# Patient Record
Sex: Male | Born: 1945
Health system: Southern US, Community
[De-identification: ages and names within clinical notes are randomized; demographics above are authoritative.]

---

## 2017-03-22 DIAGNOSIS — H34811 Central retinal vein occlusion, right eye, with macular edema: Secondary | ICD-10-CM | POA: Diagnosis not present

## 2017-03-22 DIAGNOSIS — H3582 Retinal ischemia: Secondary | ICD-10-CM | POA: Diagnosis not present

## 2017-05-03 DIAGNOSIS — H34811 Central retinal vein occlusion, right eye, with macular edema: Secondary | ICD-10-CM | POA: Diagnosis not present

## 2017-05-09 DIAGNOSIS — R69 Illness, unspecified: Secondary | ICD-10-CM | POA: Diagnosis not present

## 2017-05-31 DIAGNOSIS — H34811 Central retinal vein occlusion, right eye, with macular edema: Secondary | ICD-10-CM | POA: Diagnosis not present

## 2017-06-13 DIAGNOSIS — R7301 Impaired fasting glucose: Secondary | ICD-10-CM | POA: Diagnosis not present

## 2017-06-13 DIAGNOSIS — R21 Rash and other nonspecific skin eruption: Secondary | ICD-10-CM | POA: Diagnosis not present

## 2017-06-13 DIAGNOSIS — Z86010 Personal history of colonic polyps: Secondary | ICD-10-CM | POA: Diagnosis not present

## 2017-06-13 DIAGNOSIS — I10 Essential (primary) hypertension: Secondary | ICD-10-CM | POA: Diagnosis not present

## 2017-06-13 DIAGNOSIS — E782 Mixed hyperlipidemia: Secondary | ICD-10-CM | POA: Diagnosis not present

## 2017-06-13 DIAGNOSIS — E039 Hypothyroidism, unspecified: Secondary | ICD-10-CM | POA: Diagnosis not present

## 2017-06-13 DIAGNOSIS — Z87898 Personal history of other specified conditions: Secondary | ICD-10-CM | POA: Diagnosis not present

## 2017-06-13 DIAGNOSIS — Z0000 Encounter for general adult medical examination without abnormal findings: Secondary | ICD-10-CM | POA: Diagnosis not present

## 2017-06-13 DIAGNOSIS — Z1389 Encounter for screening for other disorder: Secondary | ICD-10-CM | POA: Diagnosis not present

## 2017-06-13 DIAGNOSIS — N400 Enlarged prostate without lower urinary tract symptoms: Secondary | ICD-10-CM | POA: Diagnosis not present

## 2017-07-05 DIAGNOSIS — H34811 Central retinal vein occlusion, right eye, with macular edema: Secondary | ICD-10-CM | POA: Diagnosis not present

## 2017-08-09 DIAGNOSIS — H34811 Central retinal vein occlusion, right eye, with macular edema: Secondary | ICD-10-CM | POA: Diagnosis not present

## 2017-09-20 DIAGNOSIS — H34811 Central retinal vein occlusion, right eye, with macular edema: Secondary | ICD-10-CM | POA: Diagnosis not present

## 2017-10-26 DIAGNOSIS — H34811 Central retinal vein occlusion, right eye, with macular edema: Secondary | ICD-10-CM | POA: Diagnosis not present

## 2017-11-15 DIAGNOSIS — Z23 Encounter for immunization: Secondary | ICD-10-CM | POA: Diagnosis not present

## 2017-11-27 DIAGNOSIS — H34811 Central retinal vein occlusion, right eye, with macular edema: Secondary | ICD-10-CM | POA: Diagnosis not present

## 2017-12-04 DIAGNOSIS — R69 Illness, unspecified: Secondary | ICD-10-CM | POA: Diagnosis not present

## 2018-01-01 DIAGNOSIS — H34811 Central retinal vein occlusion, right eye, with macular edema: Secondary | ICD-10-CM | POA: Diagnosis not present

## 2018-02-06 DIAGNOSIS — H34811 Central retinal vein occlusion, right eye, with macular edema: Secondary | ICD-10-CM | POA: Diagnosis not present

## 2018-03-12 DIAGNOSIS — H34811 Central retinal vein occlusion, right eye, with macular edema: Secondary | ICD-10-CM | POA: Diagnosis not present

## 2018-04-16 DIAGNOSIS — H34811 Central retinal vein occlusion, right eye, with macular edema: Secondary | ICD-10-CM | POA: Diagnosis not present

## 2018-05-21 DIAGNOSIS — R69 Illness, unspecified: Secondary | ICD-10-CM | POA: Diagnosis not present

## 2018-05-22 DIAGNOSIS — H34811 Central retinal vein occlusion, right eye, with macular edema: Secondary | ICD-10-CM | POA: Diagnosis not present

## 2018-06-24 DIAGNOSIS — H34811 Central retinal vein occlusion, right eye, with macular edema: Secondary | ICD-10-CM | POA: Diagnosis not present

## 2018-07-23 DIAGNOSIS — H5213 Myopia, bilateral: Secondary | ICD-10-CM | POA: Diagnosis not present

## 2018-07-25 DIAGNOSIS — I10 Essential (primary) hypertension: Secondary | ICD-10-CM | POA: Diagnosis not present

## 2018-07-25 DIAGNOSIS — Z125 Encounter for screening for malignant neoplasm of prostate: Secondary | ICD-10-CM | POA: Diagnosis not present

## 2018-07-25 DIAGNOSIS — R7301 Impaired fasting glucose: Secondary | ICD-10-CM | POA: Diagnosis not present

## 2018-08-01 DIAGNOSIS — Z86010 Personal history of colonic polyps: Secondary | ICD-10-CM | POA: Diagnosis not present

## 2018-08-01 DIAGNOSIS — I10 Essential (primary) hypertension: Secondary | ICD-10-CM | POA: Diagnosis not present

## 2018-08-01 DIAGNOSIS — Z0000 Encounter for general adult medical examination without abnormal findings: Secondary | ICD-10-CM | POA: Diagnosis not present

## 2018-08-01 DIAGNOSIS — N400 Enlarged prostate without lower urinary tract symptoms: Secondary | ICD-10-CM | POA: Diagnosis not present

## 2018-08-01 DIAGNOSIS — H348112 Central retinal vein occlusion, right eye, stable: Secondary | ICD-10-CM | POA: Diagnosis not present

## 2018-08-01 DIAGNOSIS — E782 Mixed hyperlipidemia: Secondary | ICD-10-CM | POA: Diagnosis not present

## 2018-08-01 DIAGNOSIS — Z87891 Personal history of nicotine dependence: Secondary | ICD-10-CM | POA: Diagnosis not present

## 2018-08-01 DIAGNOSIS — E039 Hypothyroidism, unspecified: Secondary | ICD-10-CM | POA: Diagnosis not present

## 2018-08-01 DIAGNOSIS — R7301 Impaired fasting glucose: Secondary | ICD-10-CM | POA: Diagnosis not present

## 2018-08-06 ENCOUNTER — Other Ambulatory Visit: Payer: Self-pay | Admitting: Family Medicine

## 2018-08-06 DIAGNOSIS — Z87891 Personal history of nicotine dependence: Secondary | ICD-10-CM

## 2018-08-07 DIAGNOSIS — L237 Allergic contact dermatitis due to plants, except food: Secondary | ICD-10-CM | POA: Diagnosis not present

## 2018-08-08 ENCOUNTER — Ambulatory Visit
Admission: RE | Admit: 2018-08-08 | Discharge: 2018-08-08 | Disposition: A | Discharge status: Home / Self Care | Payer: Medicare HMO | Source: Ambulatory Visit | Attending: Family Medicine | Admitting: Family Medicine

## 2018-08-08 ENCOUNTER — Other Ambulatory Visit: Payer: Self-pay

## 2018-08-08 DIAGNOSIS — Z87891 Personal history of nicotine dependence: Secondary | ICD-10-CM | POA: Diagnosis not present

## 2018-08-08 DIAGNOSIS — Z136 Encounter for screening for cardiovascular disorders: Secondary | ICD-10-CM | POA: Diagnosis not present

## 2018-09-12 DIAGNOSIS — H348112 Central retinal vein occlusion, right eye, stable: Secondary | ICD-10-CM | POA: Diagnosis not present

## 2018-09-30 DIAGNOSIS — Z20828 Contact with and (suspected) exposure to other viral communicable diseases: Secondary | ICD-10-CM | POA: Diagnosis not present

## 2018-10-01 DIAGNOSIS — Z20828 Contact with and (suspected) exposure to other viral communicable diseases: Secondary | ICD-10-CM | POA: Diagnosis not present

## 2018-10-11 DIAGNOSIS — M722 Plantar fascial fibromatosis: Secondary | ICD-10-CM | POA: Diagnosis not present

## 2018-11-02 DIAGNOSIS — E039 Hypothyroidism, unspecified: Secondary | ICD-10-CM | POA: Diagnosis not present

## 2018-11-02 DIAGNOSIS — E782 Mixed hyperlipidemia: Secondary | ICD-10-CM | POA: Diagnosis not present

## 2018-11-02 DIAGNOSIS — N400 Enlarged prostate without lower urinary tract symptoms: Secondary | ICD-10-CM | POA: Diagnosis not present

## 2018-11-02 DIAGNOSIS — I10 Essential (primary) hypertension: Secondary | ICD-10-CM | POA: Diagnosis not present

## 2018-11-05 ENCOUNTER — Ambulatory Visit: Payer: Medicare HMO | Admitting: Podiatry

## 2018-11-05 ENCOUNTER — Other Ambulatory Visit: Payer: Self-pay | Admitting: Podiatry

## 2018-11-05 ENCOUNTER — Ambulatory Visit (INDEPENDENT_AMBULATORY_CARE_PROVIDER_SITE_OTHER): Payer: Medicare HMO

## 2018-11-05 ENCOUNTER — Other Ambulatory Visit: Payer: Self-pay

## 2018-11-05 DIAGNOSIS — M722 Plantar fascial fibromatosis: Secondary | ICD-10-CM

## 2018-11-05 DIAGNOSIS — M7732 Calcaneal spur, left foot: Secondary | ICD-10-CM | POA: Diagnosis not present

## 2018-11-05 DIAGNOSIS — M79672 Pain in left foot: Secondary | ICD-10-CM

## 2018-11-05 MED ORDER — MELOXICAM 15 MG PO TABS: 15.0000 mg | ORAL_TABLET | Freq: Every day | ORAL | 0 refills | Status: DC

## 2018-11-05 NOTE — Patient Instructions (Signed)

## 2018-11-05 NOTE — Progress Notes (Signed)
Subjective:   Patient ID: Michael Barajas, male   DOB: 73 y.o.   MRN: [id number]   HPI 73 year old male presents the office today for concerns of pain to the bottom of his left heel is been on the last 2 months.  He did purchase an over-the-counter pad which is been somewhat helpful he saw his primary care physician was told that she has not needed to be addressed.  He denies any recent injury.  Pain is localized to the heel.  No radiating pain.  No injury.  No numbness or tingling no weakness or falls.  Review of Systems  All other systems reviewed and are negative.  No past medical history on file.    Current Outpatient Medications:  .  augmented betamethasone dipropionate (DIPROLENE-AF) 0.05 % cream, APPLY CREAM TOPICALLY TO AFFECTED AREA TWICE DAILY FOR 30 DAYS, Disp: , Rfl:  .  levothyroxine (SYNTHROID) 50 MCG tablet, TAKE 1 TABLET BY MOUTH IN THE MORNING ON AN EMPTY STOMACH, Disp: , Rfl:  .  losartan (COZAAR) 50 MG tablet, TAKE 1 TABLET BY MOUTH ONCE DAILY FOR 90 DAYS, Disp: , Rfl:  .  predniSONE (DELTASONE) 20 MG tablet, TAKE 3 TABLETS BY MOUTH ONCE DAILY FOR 2 DAYS THEN 2 TABS ONCE DAILY FOR 2 DAYS THEN 1 TAB ONCE DAILY FOR 2 DAYS THEN 1 2 (ONE HALF) TAB ONC, Disp: , Rfl:  .  simvastatin (ZOCOR) 20 MG tablet, Take 20 mg by mouth daily., Disp: , Rfl:  .  tamsulosin (FLOMAX) 0.4 MG CAPS capsule, TAKE 1 CAPSULE BY MOUTH ONCE DAILY FOR 90 DAYS, Disp: , Rfl:  .  meloxicam (MOBIC) 15 MG tablet, Take 1 tablet (15 mg total) by mouth daily., Disp: 30 tablet, Rfl: 0  No Known Allergies       Objective:  Physical Exam  General: AAO x3, NAD  Dermatological: Skin is warm, dry and supple bilateral. Nails x 10 are well manicured; remaining integument appears unremarkable at this time. There are no open sores, no preulcerative lesions, no rash or signs of infection present.  Vascular: Dorsalis Pedis artery and Posterior Tibial artery pedal pulses are 2/4 bilateral with immedate capillary  fill time. Pedal hair growth present. No varicosities and no lower extremity edema present bilateral. There is no pain with calf compression, swelling, warmth, erythema.   Neruologic: Grossly intact via light touch bilateral. Vibratory intact via tuning fork bilateral. Protective threshold with Semmes Wienstein monofilament intact to all pedal sites bilateral. Negative tinel sign.   Musculoskeletal: Tenderness to palpation along the plantar medial tubercle of the calcaneus at the insertion of plantar fascia on the left foot. There is no pain along the course of the plantar fascia within the arch of the foot. Plantar fascia appears to be intact. There is no pain with lateral compression of the calcaneus or pain with vibratory sensation. There is no pain along the course or insertion of the achilles tendon. No other areas of tenderness to bilateral lower extremities. Muscular strength 5/5 in all groups tested bilateral.  Decreased medial arch upon weightbearing.  Gait: Unassisted, Nonantalgic.       Assessment:   Left foot heel pain, plantar fasciitis, bone spur     Plan:  -Treatment options discussed including all alternatives, risks, and complications -Etiology of symptoms were discussed -X-rays were obtained and reviewed with the patient.  Impaired calcaneal spur present.  No evidence of acute fracture. -Steroid injection performed. See procedure note below -Prescribed mobic. Discussed side effects of  the medication and directed to stop if any are to occur and call the office.  -Stretching, icing daily. -Discussed the modifications and orthotics -Powersteps  Procedure: Injection Tendon/Ligament Discussed alternatives, risks, complications and verbal consent was obtained.  Location: Left plantar fascia at the glabrous junction; medial approach. Skin Prep: Alcohol  Injectate: 0.5cc 0.5% marcaine plain, 0.5 cc 2% lidocaine plain and, 1 cc kenalog 10. Disposition: Patient tolerated  procedure well. Injection site dressed with a band-aid.  Post-injection care was discussed and return precautions discussed.   Return in about 3 weeks (around 11/26/2018).  Trula Slade DPM

## 2018-11-07 DIAGNOSIS — H34811 Central retinal vein occlusion, right eye, with macular edema: Secondary | ICD-10-CM | POA: Diagnosis not present

## 2018-11-28 DIAGNOSIS — R69 Illness, unspecified: Secondary | ICD-10-CM | POA: Diagnosis not present

## 2018-12-03 ENCOUNTER — Other Ambulatory Visit: Payer: Self-pay

## 2018-12-03 ENCOUNTER — Ambulatory Visit (INDEPENDENT_AMBULATORY_CARE_PROVIDER_SITE_OTHER): Payer: Medicare HMO | Admitting: Podiatry

## 2018-12-03 DIAGNOSIS — M722 Plantar fascial fibromatosis: Secondary | ICD-10-CM

## 2018-12-03 MED ORDER — MELOXICAM 15 MG PO TABS: 15.0000 mg | ORAL_TABLET | Freq: Every day | ORAL | 0 refills | Status: AC

## 2018-12-03 NOTE — Progress Notes (Signed)
Subjective: 73 year old male presents the office today for follow-up evaluation of plantar fasciitis foot is left foot.  He states he is currently not experiencing any significant discomfort.  He is very minimal pain at times.  He is still doing stretching daily as well as using the meloxicam. He is asking if he can stop the mobic. He is asking for new powersteps as he changed his shoe size which has been helpful. Denies any systemic complaints such as fevers, chills, nausea, vomiting. No acute changes since last appointment, and no other complaints at this time.   Objective: AAO x3, NAD DP/PT pulses palpable bilaterally, CRT less than 3 seconds There is minimal tenderness to palpation along the plantar medial tubercle of the calcaneus at the insertion of plantar fascia on the right foot. There is no pain along the course of the plantar fascia within the arch of the foot. Plantar fascia appears to be intact. There is no pain with lateral compression of the calcaneus or pain with vibratory sensation. There is no pain along the course or insertion of the achilles tendon. No other areas of tenderness to bilateral lower extremities. No pain with calf compression, swelling, warmth, erythema  Assessment: Resolving right foot plantar fasciitis  Plan: -All treatment options discussed with the patient including all alternatives, risks, complications.  Hold off on another steroid injection today.  Continue with meloxicam but only as needed.  Continue stretching, icing daily.  Discussed modifications continue orthotics and new power steps were dispensed today as requested. -Patient encouraged to call the office with any questions, concerns, change in symptoms.   RTC prn or if symptoms don't completely resolve in the next month or so.   Trula Slade DPM

## 2018-12-13 DIAGNOSIS — Z23 Encounter for immunization: Secondary | ICD-10-CM | POA: Diagnosis not present

## 2018-12-24 DIAGNOSIS — R69 Illness, unspecified: Secondary | ICD-10-CM | POA: Diagnosis not present

## 2018-12-26 DIAGNOSIS — H34811 Central retinal vein occlusion, right eye, with macular edema: Secondary | ICD-10-CM | POA: Diagnosis not present

## 2019-02-13 DIAGNOSIS — H34811 Central retinal vein occlusion, right eye, with macular edema: Secondary | ICD-10-CM | POA: Diagnosis not present

## 2019-03-25 DIAGNOSIS — R69 Illness, unspecified: Secondary | ICD-10-CM | POA: Diagnosis not present

## 2019-04-17 DIAGNOSIS — H34811 Central retinal vein occlusion, right eye, with macular edema: Secondary | ICD-10-CM | POA: Diagnosis not present

## 2019-05-16 DIAGNOSIS — H25812 Combined forms of age-related cataract, left eye: Secondary | ICD-10-CM | POA: Diagnosis not present

## 2019-05-16 DIAGNOSIS — H34811 Central retinal vein occlusion, right eye, with macular edema: Secondary | ICD-10-CM | POA: Diagnosis not present

## 2019-06-20 DIAGNOSIS — H34811 Central retinal vein occlusion, right eye, with macular edema: Secondary | ICD-10-CM | POA: Diagnosis not present

## 2019-07-30 DIAGNOSIS — R509 Fever, unspecified: Secondary | ICD-10-CM | POA: Diagnosis not present

## 2019-07-30 DIAGNOSIS — Z1152 Encounter for screening for COVID-19: Secondary | ICD-10-CM | POA: Diagnosis not present

## 2019-08-01 DIAGNOSIS — R6883 Chills (without fever): Secondary | ICD-10-CM | POA: Diagnosis not present

## 2019-08-01 DIAGNOSIS — R509 Fever, unspecified: Secondary | ICD-10-CM | POA: Diagnosis not present

## 2019-08-01 DIAGNOSIS — R82998 Other abnormal findings in urine: Secondary | ICD-10-CM | POA: Diagnosis not present

## 2019-08-08 DIAGNOSIS — H34811 Central retinal vein occlusion, right eye, with macular edema: Secondary | ICD-10-CM | POA: Diagnosis not present

## 2019-08-14 DIAGNOSIS — E039 Hypothyroidism, unspecified: Secondary | ICD-10-CM | POA: Diagnosis not present

## 2019-08-14 DIAGNOSIS — I10 Essential (primary) hypertension: Secondary | ICD-10-CM | POA: Diagnosis not present

## 2019-08-14 DIAGNOSIS — E782 Mixed hyperlipidemia: Secondary | ICD-10-CM | POA: Diagnosis not present

## 2019-08-14 DIAGNOSIS — N400 Enlarged prostate without lower urinary tract symptoms: Secondary | ICD-10-CM | POA: Diagnosis not present

## 2019-08-28 ENCOUNTER — Other Ambulatory Visit (HOSPITAL_COMMUNITY): Payer: Self-pay | Admitting: Family Medicine

## 2019-08-28 DIAGNOSIS — E039 Hypothyroidism, unspecified: Secondary | ICD-10-CM | POA: Diagnosis not present

## 2019-08-28 DIAGNOSIS — Z0000 Encounter for general adult medical examination without abnormal findings: Secondary | ICD-10-CM | POA: Diagnosis not present

## 2019-08-28 DIAGNOSIS — Z125 Encounter for screening for malignant neoplasm of prostate: Secondary | ICD-10-CM | POA: Diagnosis not present

## 2019-08-28 DIAGNOSIS — I779 Disorder of arteries and arterioles, unspecified: Secondary | ICD-10-CM

## 2019-08-28 DIAGNOSIS — J3089 Other allergic rhinitis: Secondary | ICD-10-CM | POA: Diagnosis not present

## 2019-08-28 DIAGNOSIS — N400 Enlarged prostate without lower urinary tract symptoms: Secondary | ICD-10-CM | POA: Diagnosis not present

## 2019-08-28 DIAGNOSIS — Z86010 Personal history of colonic polyps: Secondary | ICD-10-CM | POA: Diagnosis not present

## 2019-08-28 DIAGNOSIS — R7301 Impaired fasting glucose: Secondary | ICD-10-CM | POA: Diagnosis not present

## 2019-08-28 DIAGNOSIS — E782 Mixed hyperlipidemia: Secondary | ICD-10-CM | POA: Diagnosis not present

## 2019-08-28 DIAGNOSIS — I10 Essential (primary) hypertension: Secondary | ICD-10-CM | POA: Diagnosis not present

## 2019-08-28 DIAGNOSIS — Z1159 Encounter for screening for other viral diseases: Secondary | ICD-10-CM | POA: Diagnosis not present

## 2019-09-04 ENCOUNTER — Ambulatory Visit (HOSPITAL_COMMUNITY)
Admission: RE | Admit: 2019-09-04 | Discharge: 2019-09-04 | Disposition: A | Discharge status: Home / Self Care | Payer: Medicare HMO | Source: Ambulatory Visit | Attending: Family Medicine | Admitting: Family Medicine

## 2019-09-04 ENCOUNTER — Other Ambulatory Visit: Payer: Self-pay

## 2019-09-04 DIAGNOSIS — I779 Disorder of arteries and arterioles, unspecified: Secondary | ICD-10-CM | POA: Diagnosis not present

## 2019-09-26 DIAGNOSIS — E782 Mixed hyperlipidemia: Secondary | ICD-10-CM | POA: Diagnosis not present

## 2019-09-26 DIAGNOSIS — N400 Enlarged prostate without lower urinary tract symptoms: Secondary | ICD-10-CM | POA: Diagnosis not present

## 2019-09-26 DIAGNOSIS — E039 Hypothyroidism, unspecified: Secondary | ICD-10-CM | POA: Diagnosis not present

## 2019-09-26 DIAGNOSIS — I10 Essential (primary) hypertension: Secondary | ICD-10-CM | POA: Diagnosis not present

## 2019-10-03 DIAGNOSIS — H34811 Central retinal vein occlusion, right eye, with macular edema: Secondary | ICD-10-CM | POA: Diagnosis not present

## 2019-10-09 DIAGNOSIS — E782 Mixed hyperlipidemia: Secondary | ICD-10-CM | POA: Diagnosis not present

## 2019-10-09 DIAGNOSIS — E039 Hypothyroidism, unspecified: Secondary | ICD-10-CM | POA: Diagnosis not present

## 2019-10-09 DIAGNOSIS — N400 Enlarged prostate without lower urinary tract symptoms: Secondary | ICD-10-CM | POA: Diagnosis not present

## 2019-10-09 DIAGNOSIS — I10 Essential (primary) hypertension: Secondary | ICD-10-CM | POA: Diagnosis not present

## 2019-11-05 DIAGNOSIS — L237 Allergic contact dermatitis due to plants, except food: Secondary | ICD-10-CM | POA: Diagnosis not present

## 2019-11-21 DIAGNOSIS — H34811 Central retinal vein occlusion, right eye, with macular edema: Secondary | ICD-10-CM | POA: Diagnosis not present

## 2019-12-12 DIAGNOSIS — H34811 Central retinal vein occlusion, right eye, with macular edema: Secondary | ICD-10-CM | POA: Diagnosis not present

## 2019-12-16 DIAGNOSIS — E782 Mixed hyperlipidemia: Secondary | ICD-10-CM | POA: Diagnosis not present

## 2019-12-16 DIAGNOSIS — E039 Hypothyroidism, unspecified: Secondary | ICD-10-CM | POA: Diagnosis not present

## 2019-12-16 DIAGNOSIS — N400 Enlarged prostate without lower urinary tract symptoms: Secondary | ICD-10-CM | POA: Diagnosis not present

## 2019-12-16 DIAGNOSIS — I10 Essential (primary) hypertension: Secondary | ICD-10-CM | POA: Diagnosis not present

## 2019-12-18 DIAGNOSIS — Z23 Encounter for immunization: Secondary | ICD-10-CM | POA: Diagnosis not present

## 2020-02-03 DIAGNOSIS — H5213 Myopia, bilateral: Secondary | ICD-10-CM | POA: Diagnosis not present

## 2020-02-25 DIAGNOSIS — H34811 Central retinal vein occlusion, right eye, with macular edema: Secondary | ICD-10-CM | POA: Diagnosis not present

## 2020-05-13 DIAGNOSIS — E039 Hypothyroidism, unspecified: Secondary | ICD-10-CM | POA: Diagnosis not present

## 2020-05-13 DIAGNOSIS — I10 Essential (primary) hypertension: Secondary | ICD-10-CM | POA: Diagnosis not present

## 2020-05-13 DIAGNOSIS — E782 Mixed hyperlipidemia: Secondary | ICD-10-CM | POA: Diagnosis not present

## 2020-05-13 DIAGNOSIS — N400 Enlarged prostate without lower urinary tract symptoms: Secondary | ICD-10-CM | POA: Diagnosis not present

## 2020-05-19 DIAGNOSIS — H34811 Central retinal vein occlusion, right eye, with macular edema: Secondary | ICD-10-CM | POA: Diagnosis not present

## 2020-05-19 DIAGNOSIS — E113211 Type 2 diabetes mellitus with mild nonproliferative diabetic retinopathy with macular edema, right eye: Secondary | ICD-10-CM | POA: Diagnosis not present

## 2020-06-30 DIAGNOSIS — H34811 Central retinal vein occlusion, right eye, with macular edema: Secondary | ICD-10-CM | POA: Diagnosis not present

## 2020-08-06 DIAGNOSIS — L237 Allergic contact dermatitis due to plants, except food: Secondary | ICD-10-CM | POA: Diagnosis not present

## 2020-08-25 DIAGNOSIS — H34811 Central retinal vein occlusion, right eye, with macular edema: Secondary | ICD-10-CM | POA: Diagnosis not present

## 2020-09-15 DIAGNOSIS — Z0000 Encounter for general adult medical examination without abnormal findings: Secondary | ICD-10-CM | POA: Diagnosis not present

## 2020-09-15 DIAGNOSIS — Z1389 Encounter for screening for other disorder: Secondary | ICD-10-CM | POA: Diagnosis not present

## 2020-09-16 DIAGNOSIS — R7303 Prediabetes: Secondary | ICD-10-CM | POA: Diagnosis not present

## 2020-09-16 DIAGNOSIS — E039 Hypothyroidism, unspecified: Secondary | ICD-10-CM | POA: Diagnosis not present

## 2020-09-16 DIAGNOSIS — Z125 Encounter for screening for malignant neoplasm of prostate: Secondary | ICD-10-CM | POA: Diagnosis not present

## 2020-09-16 DIAGNOSIS — E782 Mixed hyperlipidemia: Secondary | ICD-10-CM | POA: Diagnosis not present

## 2020-09-16 DIAGNOSIS — N400 Enlarged prostate without lower urinary tract symptoms: Secondary | ICD-10-CM | POA: Diagnosis not present

## 2020-09-16 DIAGNOSIS — I10 Essential (primary) hypertension: Secondary | ICD-10-CM | POA: Diagnosis not present

## 2020-09-16 DIAGNOSIS — I779 Disorder of arteries and arterioles, unspecified: Secondary | ICD-10-CM | POA: Diagnosis not present

## 2020-09-30 DIAGNOSIS — E039 Hypothyroidism, unspecified: Secondary | ICD-10-CM | POA: Diagnosis not present

## 2020-09-30 DIAGNOSIS — I10 Essential (primary) hypertension: Secondary | ICD-10-CM | POA: Diagnosis not present

## 2020-09-30 DIAGNOSIS — E782 Mixed hyperlipidemia: Secondary | ICD-10-CM | POA: Diagnosis not present

## 2020-09-30 DIAGNOSIS — R7303 Prediabetes: Secondary | ICD-10-CM | POA: Diagnosis not present

## 2020-09-30 DIAGNOSIS — N400 Enlarged prostate without lower urinary tract symptoms: Secondary | ICD-10-CM | POA: Diagnosis not present

## 2020-10-20 DIAGNOSIS — H34811 Central retinal vein occlusion, right eye, with macular edema: Secondary | ICD-10-CM | POA: Diagnosis not present

## 2020-10-26 DIAGNOSIS — R21 Rash and other nonspecific skin eruption: Secondary | ICD-10-CM | POA: Diagnosis not present

## 2020-10-26 DIAGNOSIS — Z6828 Body mass index (BMI) 28.0-28.9, adult: Secondary | ICD-10-CM | POA: Diagnosis not present

## 2020-11-04 DIAGNOSIS — L989 Disorder of the skin and subcutaneous tissue, unspecified: Secondary | ICD-10-CM | POA: Diagnosis not present

## 2020-11-27 IMAGING — US US ABDOMINAL AORTA SCREENING AAA
1 series · 7 of 7 positions shown · non-contrast
Comparison: None.

CLINICAL DATA: History of tobacco use.

EXAM:
US ABDOMINAL AORTA MEDICARE SCREENING
TECHNIQUE: Ultrasound examination of the abdominal aorta was performed as a
screening evaluation for abdominal aortic aneurysm.

[Series 1: us abdominal aorta screening aaa · 0.23mm/px · 7 of 7 slices shown]
[im 1/7]
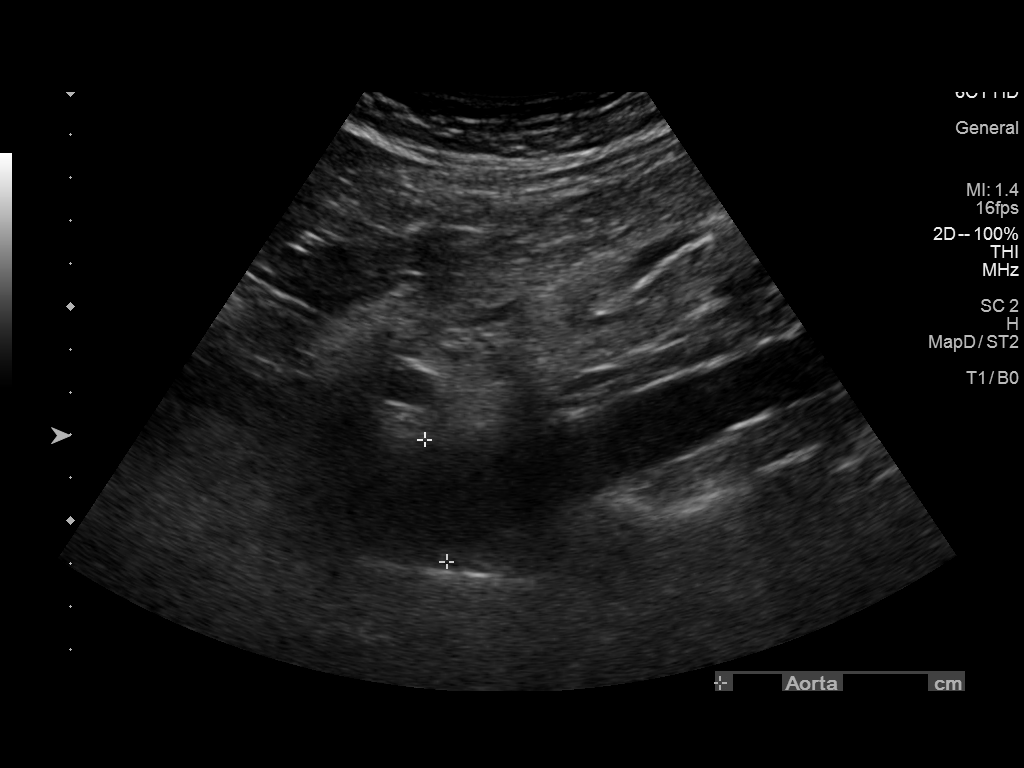
[im 2/7]
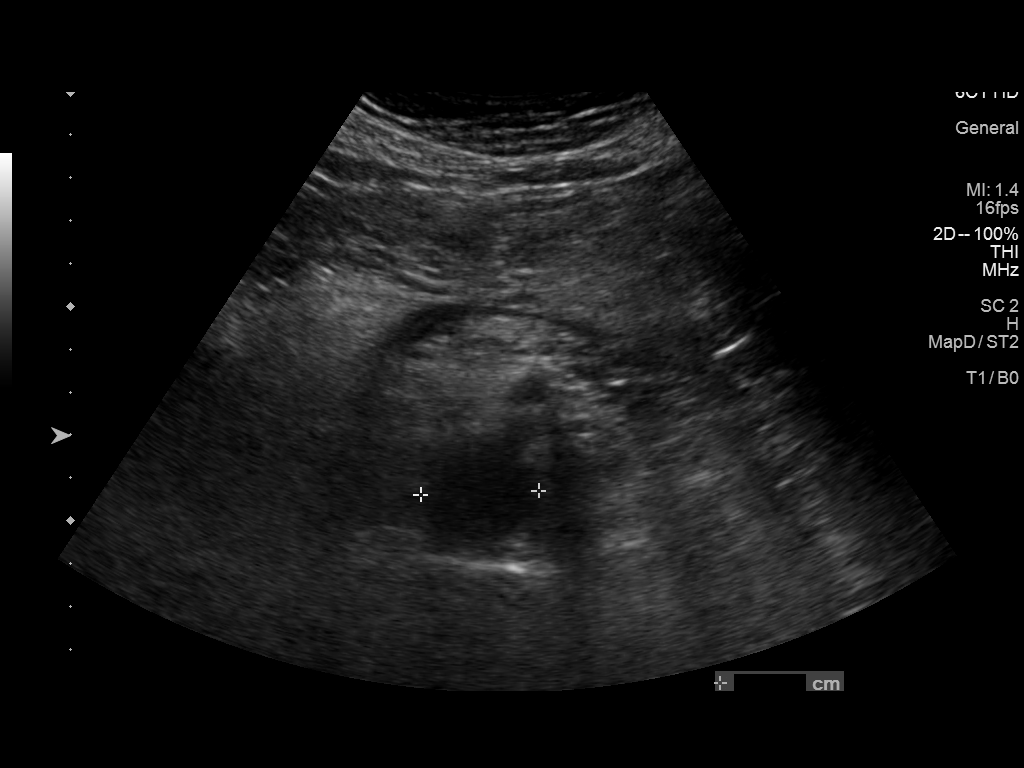
[im 3/7]
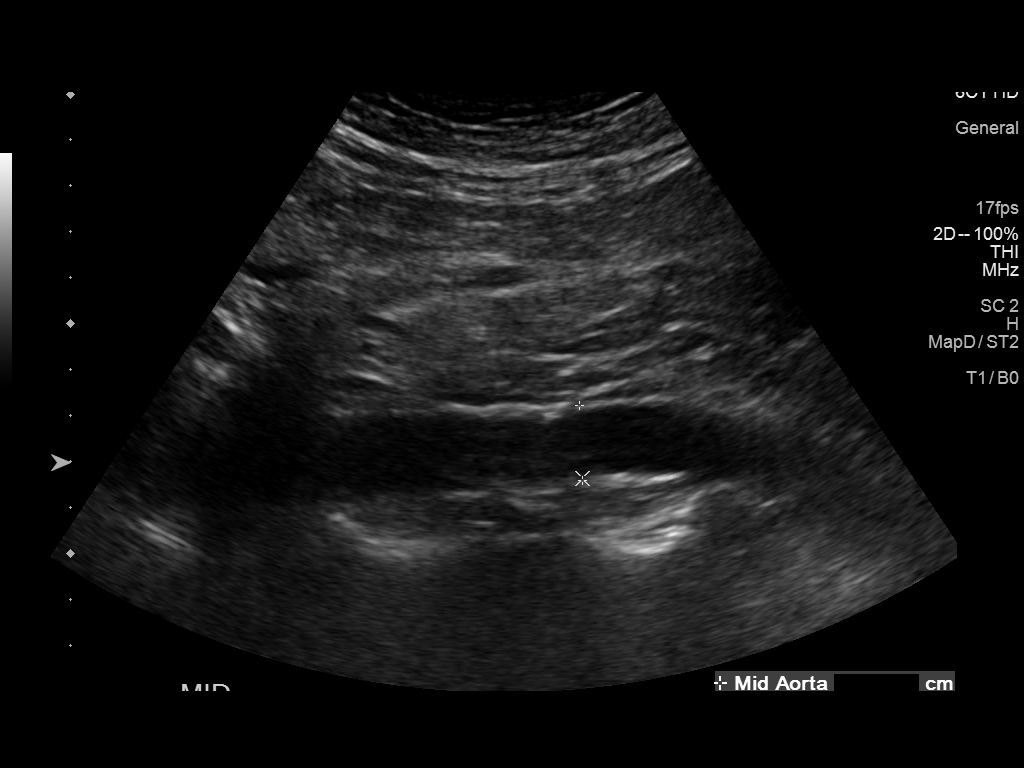
[im 4/7]
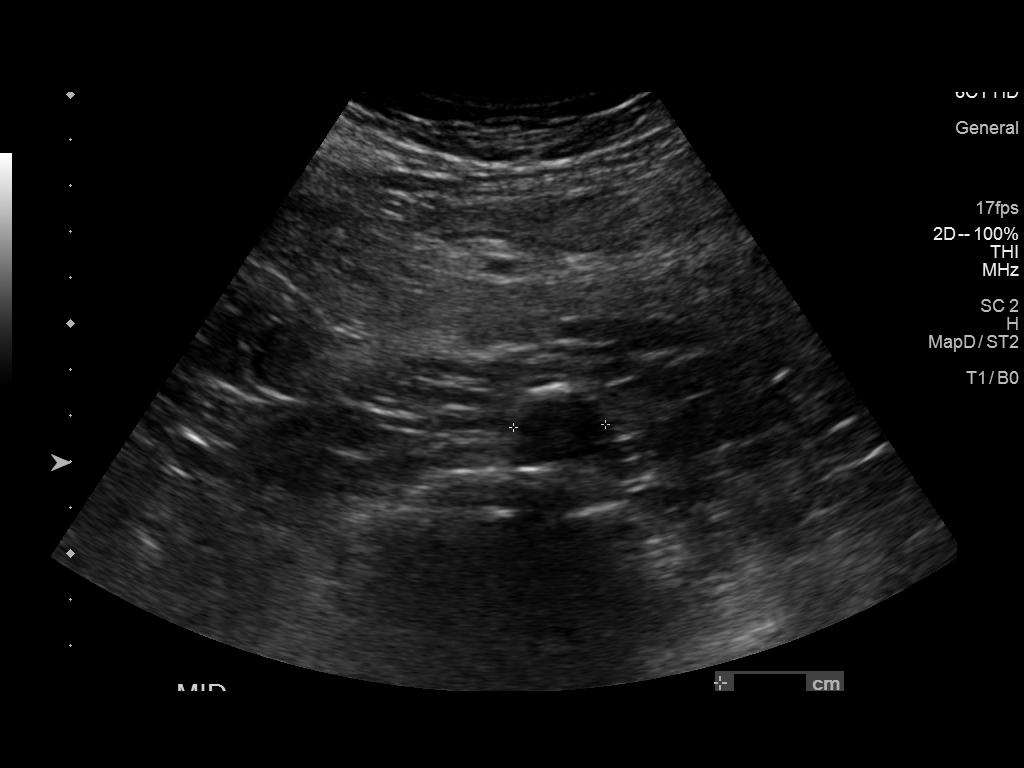
[im 5/7]
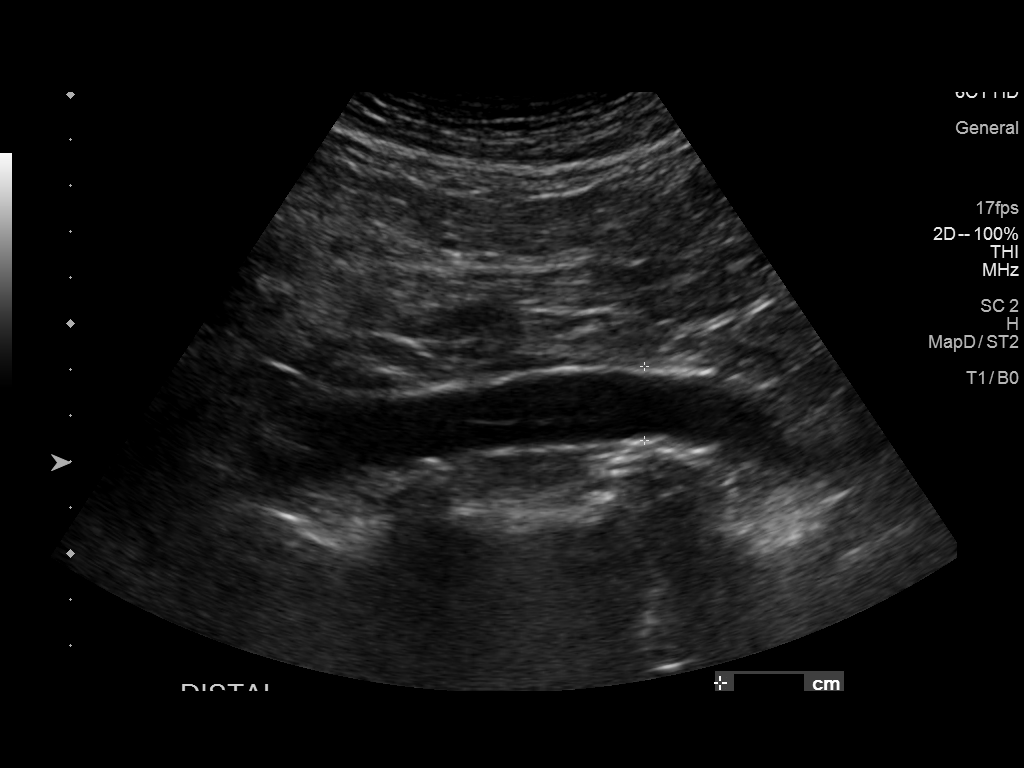
[im 6/7]
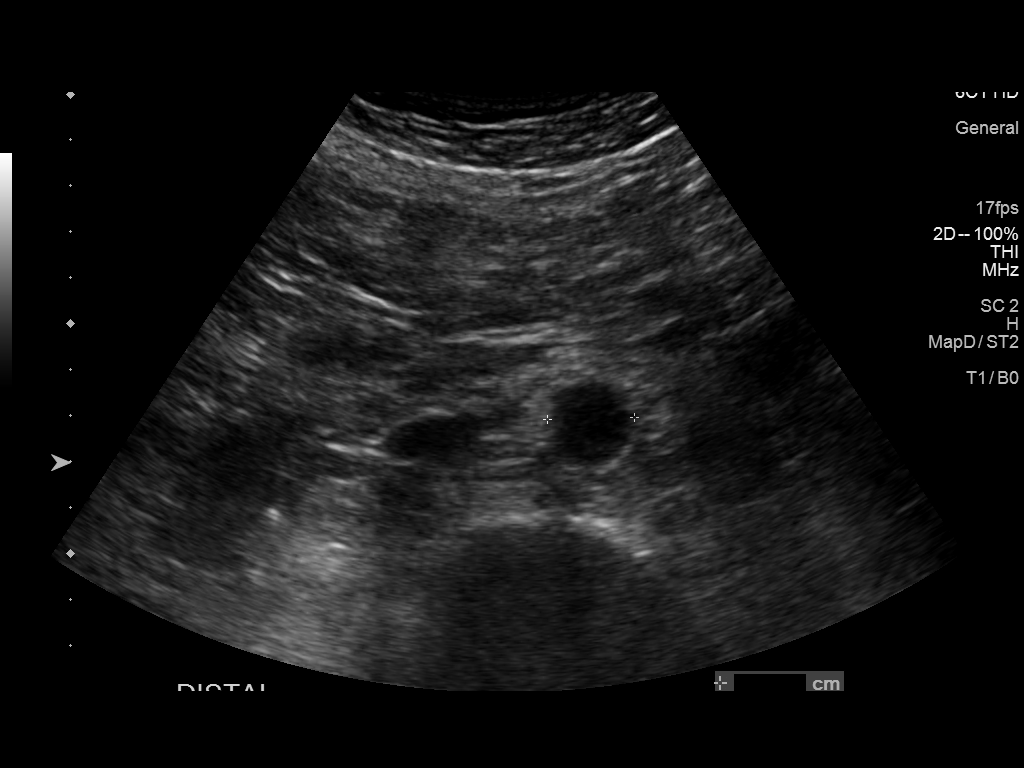
[im 7/7]
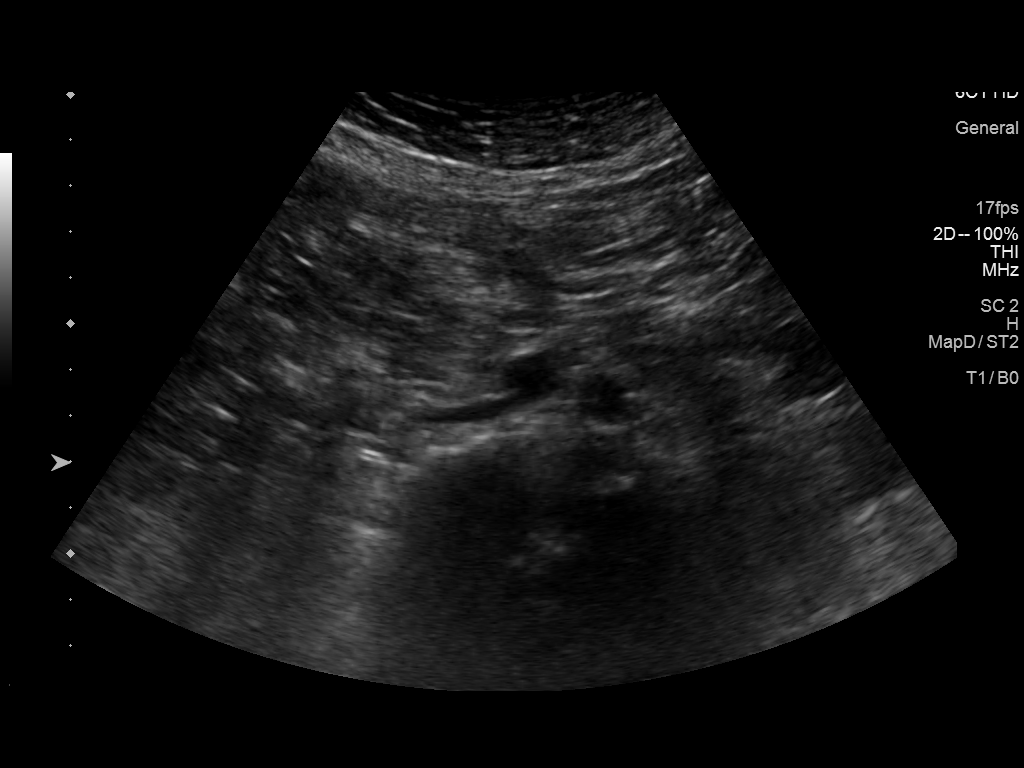

[7 of 7 positions shown; findings below may reference images not displayed]

FINDINGS: Abdominal aortic measurements as follows:

Proximal:  2.9 cm

Mid:  2.0 cm

Distal:  1.9 cm
IMPRESSION: No sonographic evidence of abdominal aortic aneurysm.

## 2020-12-11 DIAGNOSIS — Z23 Encounter for immunization: Secondary | ICD-10-CM | POA: Diagnosis not present

## 2020-12-14 DIAGNOSIS — I10 Essential (primary) hypertension: Secondary | ICD-10-CM | POA: Diagnosis not present

## 2020-12-15 DIAGNOSIS — H34811 Central retinal vein occlusion, right eye, with macular edema: Secondary | ICD-10-CM | POA: Diagnosis not present

## 2021-02-09 DIAGNOSIS — H34811 Central retinal vein occlusion, right eye, with macular edema: Secondary | ICD-10-CM | POA: Diagnosis not present

## 2021-02-09 DIAGNOSIS — H25812 Combined forms of age-related cataract, left eye: Secondary | ICD-10-CM | POA: Diagnosis not present

## 2021-03-15 DIAGNOSIS — Z86010 Personal history of colonic polyps: Secondary | ICD-10-CM | POA: Diagnosis not present

## 2021-03-15 DIAGNOSIS — K649 Unspecified hemorrhoids: Secondary | ICD-10-CM | POA: Diagnosis not present

## 2021-03-15 DIAGNOSIS — D123 Benign neoplasm of transverse colon: Secondary | ICD-10-CM | POA: Diagnosis not present

## 2021-03-18 DIAGNOSIS — D123 Benign neoplasm of transverse colon: Secondary | ICD-10-CM | POA: Diagnosis not present

## 2021-03-31 DIAGNOSIS — I10 Essential (primary) hypertension: Secondary | ICD-10-CM | POA: Diagnosis not present

## 2021-03-31 DIAGNOSIS — R7303 Prediabetes: Secondary | ICD-10-CM | POA: Diagnosis not present

## 2021-04-06 DIAGNOSIS — H34811 Central retinal vein occlusion, right eye, with macular edema: Secondary | ICD-10-CM | POA: Diagnosis not present

## 2021-06-08 DIAGNOSIS — H34811 Central retinal vein occlusion, right eye, with macular edema: Secondary | ICD-10-CM | POA: Diagnosis not present

## 2021-08-17 DIAGNOSIS — H34811 Central retinal vein occlusion, right eye, with macular edema: Secondary | ICD-10-CM | POA: Diagnosis not present

## 2021-09-02 DIAGNOSIS — L255 Unspecified contact dermatitis due to plants, except food: Secondary | ICD-10-CM | POA: Diagnosis not present

## 2021-09-08 DIAGNOSIS — N400 Enlarged prostate without lower urinary tract symptoms: Secondary | ICD-10-CM | POA: Diagnosis not present

## 2021-09-08 DIAGNOSIS — Z0000 Encounter for general adult medical examination without abnormal findings: Secondary | ICD-10-CM | POA: Diagnosis not present

## 2021-09-08 DIAGNOSIS — Z125 Encounter for screening for malignant neoplasm of prostate: Secondary | ICD-10-CM | POA: Diagnosis not present

## 2021-09-08 DIAGNOSIS — I10 Essential (primary) hypertension: Secondary | ICD-10-CM | POA: Diagnosis not present

## 2021-09-15 DIAGNOSIS — I10 Essential (primary) hypertension: Secondary | ICD-10-CM | POA: Diagnosis not present

## 2021-09-15 DIAGNOSIS — Z0000 Encounter for general adult medical examination without abnormal findings: Secondary | ICD-10-CM | POA: Diagnosis not present

## 2021-09-15 DIAGNOSIS — E039 Hypothyroidism, unspecified: Secondary | ICD-10-CM | POA: Diagnosis not present

## 2021-09-15 DIAGNOSIS — R7303 Prediabetes: Secondary | ICD-10-CM | POA: Diagnosis not present

## 2021-11-01 DIAGNOSIS — I10 Essential (primary) hypertension: Secondary | ICD-10-CM | POA: Diagnosis not present

## 2021-11-01 DIAGNOSIS — B351 Tinea unguium: Secondary | ICD-10-CM | POA: Diagnosis not present

## 2021-11-02 DIAGNOSIS — H34811 Central retinal vein occlusion, right eye, with macular edema: Secondary | ICD-10-CM | POA: Diagnosis not present

## 2021-12-12 DIAGNOSIS — H25812 Combined forms of age-related cataract, left eye: Secondary | ICD-10-CM | POA: Diagnosis not present

## 2021-12-12 DIAGNOSIS — E113211 Type 2 diabetes mellitus with mild nonproliferative diabetic retinopathy with macular edema, right eye: Secondary | ICD-10-CM | POA: Diagnosis not present

## 2021-12-12 DIAGNOSIS — H524 Presbyopia: Secondary | ICD-10-CM | POA: Diagnosis not present

## 2021-12-12 DIAGNOSIS — H34811 Central retinal vein occlusion, right eye, with macular edema: Secondary | ICD-10-CM | POA: Diagnosis not present

## 2021-12-13 DIAGNOSIS — H52223 Regular astigmatism, bilateral: Secondary | ICD-10-CM | POA: Diagnosis not present

## 2021-12-13 DIAGNOSIS — H524 Presbyopia: Secondary | ICD-10-CM | POA: Diagnosis not present

## 2022-01-10 DIAGNOSIS — Z23 Encounter for immunization: Secondary | ICD-10-CM | POA: Diagnosis not present

## 2022-01-25 DIAGNOSIS — H25812 Combined forms of age-related cataract, left eye: Secondary | ICD-10-CM | POA: Diagnosis not present

## 2022-01-25 DIAGNOSIS — H34811 Central retinal vein occlusion, right eye, with macular edema: Secondary | ICD-10-CM | POA: Diagnosis not present

## 2022-03-14 DIAGNOSIS — I10 Essential (primary) hypertension: Secondary | ICD-10-CM | POA: Diagnosis not present

## 2022-03-14 DIAGNOSIS — E782 Mixed hyperlipidemia: Secondary | ICD-10-CM | POA: Diagnosis not present

## 2022-03-14 DIAGNOSIS — N400 Enlarged prostate without lower urinary tract symptoms: Secondary | ICD-10-CM | POA: Diagnosis not present

## 2022-03-14 DIAGNOSIS — R7303 Prediabetes: Secondary | ICD-10-CM | POA: Diagnosis not present

## 2022-03-16 DIAGNOSIS — K080 Exfoliation of teeth due to systemic causes: Secondary | ICD-10-CM | POA: Diagnosis not present

## 2022-04-05 DIAGNOSIS — H34811 Central retinal vein occlusion, right eye, with macular edema: Secondary | ICD-10-CM | POA: Diagnosis not present

## 2022-05-30 DIAGNOSIS — N481 Balanitis: Secondary | ICD-10-CM | POA: Diagnosis not present

## 2022-06-14 DIAGNOSIS — H34811 Central retinal vein occlusion, right eye, with macular edema: Secondary | ICD-10-CM | POA: Diagnosis not present

## 2022-07-04 DIAGNOSIS — L237 Allergic contact dermatitis due to plants, except food: Secondary | ICD-10-CM | POA: Diagnosis not present

## 2022-07-04 DIAGNOSIS — K080 Exfoliation of teeth due to systemic causes: Secondary | ICD-10-CM | POA: Diagnosis not present

## 2022-08-02 DIAGNOSIS — R21 Rash and other nonspecific skin eruption: Secondary | ICD-10-CM | POA: Diagnosis not present

## 2022-09-06 DIAGNOSIS — H34811 Central retinal vein occlusion, right eye, with macular edema: Secondary | ICD-10-CM | POA: Diagnosis not present

## 2022-09-14 DIAGNOSIS — R7303 Prediabetes: Secondary | ICD-10-CM | POA: Diagnosis not present

## 2022-09-14 DIAGNOSIS — Z125 Encounter for screening for malignant neoplasm of prostate: Secondary | ICD-10-CM | POA: Diagnosis not present

## 2022-09-14 DIAGNOSIS — E782 Mixed hyperlipidemia: Secondary | ICD-10-CM | POA: Diagnosis not present

## 2022-09-14 DIAGNOSIS — E039 Hypothyroidism, unspecified: Secondary | ICD-10-CM | POA: Diagnosis not present

## 2022-09-14 DIAGNOSIS — I10 Essential (primary) hypertension: Secondary | ICD-10-CM | POA: Diagnosis not present

## 2022-09-21 DIAGNOSIS — Z0000 Encounter for general adult medical examination without abnormal findings: Secondary | ICD-10-CM | POA: Diagnosis not present

## 2022-09-21 DIAGNOSIS — I779 Disorder of arteries and arterioles, unspecified: Secondary | ICD-10-CM | POA: Diagnosis not present

## 2022-09-21 DIAGNOSIS — E782 Mixed hyperlipidemia: Secondary | ICD-10-CM | POA: Diagnosis not present

## 2022-09-21 DIAGNOSIS — N400 Enlarged prostate without lower urinary tract symptoms: Secondary | ICD-10-CM | POA: Diagnosis not present

## 2022-09-21 DIAGNOSIS — I10 Essential (primary) hypertension: Secondary | ICD-10-CM | POA: Diagnosis not present

## 2022-11-15 DIAGNOSIS — H34811 Central retinal vein occlusion, right eye, with macular edema: Secondary | ICD-10-CM | POA: Diagnosis not present

## 2022-12-11 DIAGNOSIS — Z03818 Encounter for observation for suspected exposure to other biological agents ruled out: Secondary | ICD-10-CM | POA: Diagnosis not present

## 2022-12-11 DIAGNOSIS — R051 Acute cough: Secondary | ICD-10-CM | POA: Diagnosis not present

## 2022-12-11 DIAGNOSIS — J069 Acute upper respiratory infection, unspecified: Secondary | ICD-10-CM | POA: Diagnosis not present

## 2023-01-11 DIAGNOSIS — Z23 Encounter for immunization: Secondary | ICD-10-CM | POA: Diagnosis not present

## 2023-01-23 DIAGNOSIS — K080 Exfoliation of teeth due to systemic causes: Secondary | ICD-10-CM | POA: Diagnosis not present

## 2023-01-31 DIAGNOSIS — H34811 Central retinal vein occlusion, right eye, with macular edema: Secondary | ICD-10-CM | POA: Diagnosis not present

## 2023-02-14 DIAGNOSIS — N481 Balanitis: Secondary | ICD-10-CM | POA: Diagnosis not present

## 2023-03-22 DIAGNOSIS — E1169 Type 2 diabetes mellitus with other specified complication: Secondary | ICD-10-CM | POA: Diagnosis not present

## 2023-03-22 DIAGNOSIS — E039 Hypothyroidism, unspecified: Secondary | ICD-10-CM | POA: Diagnosis not present

## 2023-03-22 DIAGNOSIS — E782 Mixed hyperlipidemia: Secondary | ICD-10-CM | POA: Diagnosis not present

## 2023-03-22 DIAGNOSIS — I10 Essential (primary) hypertension: Secondary | ICD-10-CM | POA: Diagnosis not present

## 2023-04-17 DIAGNOSIS — H6693 Otitis media, unspecified, bilateral: Secondary | ICD-10-CM | POA: Diagnosis not present

## 2023-04-17 DIAGNOSIS — T161XXA Foreign body in right ear, initial encounter: Secondary | ICD-10-CM | POA: Diagnosis not present

## 2023-04-18 DIAGNOSIS — H34811 Central retinal vein occlusion, right eye, with macular edema: Secondary | ICD-10-CM | POA: Diagnosis not present

## 2023-07-04 DIAGNOSIS — H34811 Central retinal vein occlusion, right eye, with macular edema: Secondary | ICD-10-CM | POA: Diagnosis not present

## 2023-07-24 DIAGNOSIS — K080 Exfoliation of teeth due to systemic causes: Secondary | ICD-10-CM | POA: Diagnosis not present

## 2023-07-26 DIAGNOSIS — S30860A Insect bite (nonvenomous) of lower back and pelvis, initial encounter: Secondary | ICD-10-CM | POA: Diagnosis not present

## 2023-07-26 DIAGNOSIS — R21 Rash and other nonspecific skin eruption: Secondary | ICD-10-CM | POA: Diagnosis not present

## 2023-07-26 DIAGNOSIS — I10 Essential (primary) hypertension: Secondary | ICD-10-CM | POA: Diagnosis not present

## 2023-08-02 DIAGNOSIS — H5213 Myopia, bilateral: Secondary | ICD-10-CM | POA: Diagnosis not present

## 2023-08-21 DIAGNOSIS — Z6827 Body mass index (BMI) 27.0-27.9, adult: Secondary | ICD-10-CM | POA: Diagnosis not present

## 2023-08-21 DIAGNOSIS — M25561 Pain in right knee: Secondary | ICD-10-CM | POA: Diagnosis not present

## 2023-08-21 DIAGNOSIS — I10 Essential (primary) hypertension: Secondary | ICD-10-CM | POA: Diagnosis not present

## 2023-09-19 DIAGNOSIS — H34811 Central retinal vein occlusion, right eye, with macular edema: Secondary | ICD-10-CM | POA: Diagnosis not present

## 2023-09-25 DIAGNOSIS — I10 Essential (primary) hypertension: Secondary | ICD-10-CM | POA: Diagnosis not present

## 2023-09-25 DIAGNOSIS — E782 Mixed hyperlipidemia: Secondary | ICD-10-CM | POA: Diagnosis not present

## 2023-09-25 DIAGNOSIS — I779 Disorder of arteries and arterioles, unspecified: Secondary | ICD-10-CM | POA: Diagnosis not present

## 2023-09-25 DIAGNOSIS — N400 Enlarged prostate without lower urinary tract symptoms: Secondary | ICD-10-CM | POA: Diagnosis not present

## 2023-09-25 DIAGNOSIS — E1169 Type 2 diabetes mellitus with other specified complication: Secondary | ICD-10-CM | POA: Diagnosis not present

## 2023-09-25 DIAGNOSIS — E039 Hypothyroidism, unspecified: Secondary | ICD-10-CM | POA: Diagnosis not present

## 2023-09-27 DIAGNOSIS — E039 Hypothyroidism, unspecified: Secondary | ICD-10-CM | POA: Diagnosis not present

## 2023-09-27 DIAGNOSIS — Z0000 Encounter for general adult medical examination without abnormal findings: Secondary | ICD-10-CM | POA: Diagnosis not present

## 2023-09-27 DIAGNOSIS — I10 Essential (primary) hypertension: Secondary | ICD-10-CM | POA: Diagnosis not present

## 2023-09-27 DIAGNOSIS — E1169 Type 2 diabetes mellitus with other specified complication: Secondary | ICD-10-CM | POA: Diagnosis not present

## 2023-09-27 DIAGNOSIS — E782 Mixed hyperlipidemia: Secondary | ICD-10-CM | POA: Diagnosis not present

## 2023-12-12 DIAGNOSIS — H34811 Central retinal vein occlusion, right eye, with macular edema: Secondary | ICD-10-CM | POA: Diagnosis not present

## 2023-12-20 DIAGNOSIS — Z23 Encounter for immunization: Secondary | ICD-10-CM | POA: Diagnosis not present

## 2024-02-21 DIAGNOSIS — K080 Exfoliation of teeth due to systemic causes: Secondary | ICD-10-CM | POA: Diagnosis not present
